# Patient Record
Sex: Male | Born: 1995 | Race: White | Hispanic: Yes | Marital: Single | Smoking: Current every day smoker
Health system: Southern US, Community
[De-identification: ages and names within clinical notes are randomized; demographics above are authoritative.]

## PROBLEM LIST (undated history)

## (undated) DIAGNOSIS — J302 Other seasonal allergic rhinitis: Secondary | ICD-10-CM

---
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
## 2016-03-19 ENCOUNTER — Emergency Department (HOSPITAL_COMMUNITY): Payer: Commercial Managed Care - HMO

## 2016-03-19 ENCOUNTER — Encounter (HOSPITAL_COMMUNITY): Payer: Self-pay | Admitting: Emergency Medicine

## 2016-03-19 ENCOUNTER — Emergency Department (HOSPITAL_COMMUNITY)
Admission: EM | Admit: 2016-03-19 | Discharge: 2016-03-19 | Disposition: A | Discharge status: Home / Self Care | Payer: Commercial Managed Care - HMO | Attending: Emergency Medicine | Admitting: Emergency Medicine

## 2016-03-19 DIAGNOSIS — Y9389 Activity, other specified: Secondary | ICD-10-CM | POA: Insufficient documentation

## 2016-03-19 DIAGNOSIS — S30811A Abrasion of abdominal wall, initial encounter: Secondary | ICD-10-CM | POA: Insufficient documentation

## 2016-03-19 DIAGNOSIS — S20312A Abrasion of left front wall of thorax, initial encounter: Secondary | ICD-10-CM | POA: Diagnosis not present

## 2016-03-19 DIAGNOSIS — Y929 Unspecified place or not applicable: Secondary | ICD-10-CM | POA: Diagnosis not present

## 2016-03-19 DIAGNOSIS — Y999 Unspecified external cause status: Secondary | ICD-10-CM | POA: Diagnosis not present

## 2016-03-19 DIAGNOSIS — S299XXA Unspecified injury of thorax, initial encounter: Secondary | ICD-10-CM | POA: Diagnosis present

## 2016-03-19 DIAGNOSIS — F17200 Nicotine dependence, unspecified, uncomplicated: Secondary | ICD-10-CM | POA: Insufficient documentation

## 2016-03-19 HISTORY — DX: Other seasonal allergic rhinitis: J30.2

## 2016-03-19 MED ORDER — ACETAMINOPHEN 325 MG PO TABS
650.0000 mg | ORAL_TABLET | Freq: Once | ORAL | Status: AC
  Administered 2016-03-19: 650 mg via ORAL
  Filled 2016-03-19: qty 2

## 2016-03-19 MED ORDER — CYCLOBENZAPRINE HCL 5 MG PO TABS: 5.0000 mg | ORAL_TABLET | Freq: Three times a day (TID) | ORAL | Status: AC | PRN
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
## 2016-03-19 NOTE — Discharge Instructions (Signed)
Read the information below.  Your x-rays were negative. You are being prescribed a muscle relaxer, take as needed for muscle spasms. You can take tylenol 650mg  or motrin 400mg  every 6hrs for pain control. Be sure to rest.  Use the prescribed medication as directed.  Please discuss all new medications with your pharmacist.   It is important that you follow up with your primary care provider in 7-10 days following a ED visit.  You may return to the Emergency Department at any time for worsening condition or any new symptoms that concern you. Return to ED if your symptoms worsen or you develop nausea, blood in urine, blood in stool, agitation, confusion, loss of consciousness, inability to awaken, changes in vision, worsening headache, seizures, or weakness/numbness.
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
## 2019-09-24 ENCOUNTER — Ambulatory Visit: Payer: Self-pay | Attending: Internal Medicine

## 2019-09-24 DIAGNOSIS — Z20822 Contact with and (suspected) exposure to covid-19: Secondary | ICD-10-CM

## 2019-09-24 DIAGNOSIS — Z20828 Contact with and (suspected) exposure to other viral communicable diseases: Secondary | ICD-10-CM | POA: Insufficient documentation

## 2019-09-25 LAB — NOVEL CORONAVIRUS, NAA: SARS-CoV-2, NAA: NOT DETECTED

## 2022-06-22 DIAGNOSIS — F331 Major depressive disorder, recurrent, moderate: Secondary | ICD-10-CM | POA: Diagnosis not present

## 2022-06-22 DIAGNOSIS — Z113 Encounter for screening for infections with a predominantly sexual mode of transmission: Secondary | ICD-10-CM | POA: Diagnosis not present

## 2022-06-22 DIAGNOSIS — Z131 Encounter for screening for diabetes mellitus: Secondary | ICD-10-CM | POA: Diagnosis not present

## 2022-06-22 DIAGNOSIS — F1010 Alcohol abuse, uncomplicated: Secondary | ICD-10-CM | POA: Diagnosis not present

## 2022-06-22 DIAGNOSIS — F411 Generalized anxiety disorder: Secondary | ICD-10-CM | POA: Diagnosis not present

## 2022-06-22 DIAGNOSIS — Z0000 Encounter for general adult medical examination without abnormal findings: Secondary | ICD-10-CM | POA: Diagnosis not present

## 2022-06-22 DIAGNOSIS — L709 Acne, unspecified: Secondary | ICD-10-CM | POA: Diagnosis not present

## 2022-06-22 DIAGNOSIS — Z13220 Encounter for screening for lipoid disorders: Secondary | ICD-10-CM | POA: Diagnosis not present

## 2022-08-03 DIAGNOSIS — F331 Major depressive disorder, recurrent, moderate: Secondary | ICD-10-CM | POA: Diagnosis not present

## 2022-08-03 DIAGNOSIS — F411 Generalized anxiety disorder: Secondary | ICD-10-CM | POA: Diagnosis not present

## 2022-08-05 DIAGNOSIS — F1010 Alcohol abuse, uncomplicated: Secondary | ICD-10-CM | POA: Diagnosis not present

## 2022-08-05 DIAGNOSIS — F331 Major depressive disorder, recurrent, moderate: Secondary | ICD-10-CM | POA: Diagnosis not present

## 2022-08-12 DIAGNOSIS — F1010 Alcohol abuse, uncomplicated: Secondary | ICD-10-CM | POA: Diagnosis not present

## 2022-08-12 DIAGNOSIS — F331 Major depressive disorder, recurrent, moderate: Secondary | ICD-10-CM | POA: Diagnosis not present

## 2022-08-26 DIAGNOSIS — F1010 Alcohol abuse, uncomplicated: Secondary | ICD-10-CM | POA: Diagnosis not present

## 2022-08-26 DIAGNOSIS — F331 Major depressive disorder, recurrent, moderate: Secondary | ICD-10-CM | POA: Diagnosis not present

## 2022-09-01 DIAGNOSIS — F331 Major depressive disorder, recurrent, moderate: Secondary | ICD-10-CM | POA: Diagnosis not present

## 2022-09-01 DIAGNOSIS — F1010 Alcohol abuse, uncomplicated: Secondary | ICD-10-CM | POA: Diagnosis not present

## 2022-09-06 DIAGNOSIS — F1010 Alcohol abuse, uncomplicated: Secondary | ICD-10-CM | POA: Diagnosis not present

## 2022-09-06 DIAGNOSIS — F331 Major depressive disorder, recurrent, moderate: Secondary | ICD-10-CM | POA: Diagnosis not present

## 2022-09-06 DIAGNOSIS — Z6825 Body mass index (BMI) 25.0-25.9, adult: Secondary | ICD-10-CM | POA: Diagnosis not present

## 2022-09-06 DIAGNOSIS — F411 Generalized anxiety disorder: Secondary | ICD-10-CM | POA: Diagnosis not present

## 2022-09-13 DIAGNOSIS — Z79899 Other long term (current) drug therapy: Secondary | ICD-10-CM | POA: Diagnosis not present

## 2022-09-13 DIAGNOSIS — F1010 Alcohol abuse, uncomplicated: Secondary | ICD-10-CM | POA: Diagnosis not present

## 2022-09-13 DIAGNOSIS — F331 Major depressive disorder, recurrent, moderate: Secondary | ICD-10-CM | POA: Diagnosis not present

## 2022-09-22 DIAGNOSIS — F1010 Alcohol abuse, uncomplicated: Secondary | ICD-10-CM | POA: Diagnosis not present

## 2022-09-22 DIAGNOSIS — F331 Major depressive disorder, recurrent, moderate: Secondary | ICD-10-CM | POA: Diagnosis not present

## 2022-10-06 DIAGNOSIS — F1010 Alcohol abuse, uncomplicated: Secondary | ICD-10-CM | POA: Diagnosis not present

## 2022-10-06 DIAGNOSIS — F331 Major depressive disorder, recurrent, moderate: Secondary | ICD-10-CM | POA: Diagnosis not present

## 2022-10-14 DIAGNOSIS — F4310 Post-traumatic stress disorder, unspecified: Secondary | ICD-10-CM | POA: Diagnosis not present

## 2022-10-14 DIAGNOSIS — F331 Major depressive disorder, recurrent, moderate: Secondary | ICD-10-CM | POA: Diagnosis not present

## 2022-10-14 DIAGNOSIS — F1010 Alcohol abuse, uncomplicated: Secondary | ICD-10-CM | POA: Diagnosis not present

## 2022-10-17 DIAGNOSIS — F411 Generalized anxiety disorder: Secondary | ICD-10-CM | POA: Diagnosis not present

## 2022-10-17 DIAGNOSIS — F1010 Alcohol abuse, uncomplicated: Secondary | ICD-10-CM | POA: Diagnosis not present

## 2022-10-17 DIAGNOSIS — F331 Major depressive disorder, recurrent, moderate: Secondary | ICD-10-CM | POA: Diagnosis not present

## 2022-10-20 DIAGNOSIS — F1010 Alcohol abuse, uncomplicated: Secondary | ICD-10-CM | POA: Diagnosis not present

## 2022-10-20 DIAGNOSIS — F331 Major depressive disorder, recurrent, moderate: Secondary | ICD-10-CM | POA: Diagnosis not present

## 2022-11-11 DIAGNOSIS — F331 Major depressive disorder, recurrent, moderate: Secondary | ICD-10-CM | POA: Diagnosis not present

## 2022-11-11 DIAGNOSIS — F1010 Alcohol abuse, uncomplicated: Secondary | ICD-10-CM | POA: Diagnosis not present

## 2022-11-11 DIAGNOSIS — F4310 Post-traumatic stress disorder, unspecified: Secondary | ICD-10-CM | POA: Diagnosis not present

## 2022-11-16 DIAGNOSIS — M26609 Unspecified temporomandibular joint disorder, unspecified side: Secondary | ICD-10-CM | POA: Diagnosis not present

## 2022-11-17 DIAGNOSIS — F331 Major depressive disorder, recurrent, moderate: Secondary | ICD-10-CM | POA: Diagnosis not present

## 2022-11-17 DIAGNOSIS — F1010 Alcohol abuse, uncomplicated: Secondary | ICD-10-CM | POA: Diagnosis not present

## 2022-11-30 DIAGNOSIS — F331 Major depressive disorder, recurrent, moderate: Secondary | ICD-10-CM | POA: Diagnosis not present

## 2022-11-30 DIAGNOSIS — F1010 Alcohol abuse, uncomplicated: Secondary | ICD-10-CM | POA: Diagnosis not present

## 2022-12-19 DIAGNOSIS — F331 Major depressive disorder, recurrent, moderate: Secondary | ICD-10-CM | POA: Diagnosis not present

## 2022-12-19 DIAGNOSIS — F4310 Post-traumatic stress disorder, unspecified: Secondary | ICD-10-CM | POA: Diagnosis not present

## 2022-12-19 DIAGNOSIS — F1010 Alcohol abuse, uncomplicated: Secondary | ICD-10-CM | POA: Diagnosis not present

## 2022-12-28 DIAGNOSIS — F331 Major depressive disorder, recurrent, moderate: Secondary | ICD-10-CM | POA: Diagnosis not present

## 2022-12-28 DIAGNOSIS — F1010 Alcohol abuse, uncomplicated: Secondary | ICD-10-CM | POA: Diagnosis not present

## 2023-01-11 DIAGNOSIS — F1010 Alcohol abuse, uncomplicated: Secondary | ICD-10-CM | POA: Diagnosis not present

## 2023-01-11 DIAGNOSIS — F331 Major depressive disorder, recurrent, moderate: Secondary | ICD-10-CM | POA: Diagnosis not present

## 2023-01-16 DIAGNOSIS — F39 Unspecified mood [affective] disorder: Secondary | ICD-10-CM | POA: Diagnosis not present

## 2023-01-16 DIAGNOSIS — F1020 Alcohol dependence, uncomplicated: Secondary | ICD-10-CM | POA: Diagnosis not present

## 2023-01-16 DIAGNOSIS — F4310 Post-traumatic stress disorder, unspecified: Secondary | ICD-10-CM | POA: Diagnosis not present

## 2023-01-24 DIAGNOSIS — F1020 Alcohol dependence, uncomplicated: Secondary | ICD-10-CM | POA: Diagnosis not present

## 2023-02-08 DIAGNOSIS — H5213 Myopia, bilateral: Secondary | ICD-10-CM | POA: Diagnosis not present

## 2023-02-08 DIAGNOSIS — H52203 Unspecified astigmatism, bilateral: Secondary | ICD-10-CM | POA: Diagnosis not present

## 2023-02-15 DIAGNOSIS — F331 Major depressive disorder, recurrent, moderate: Secondary | ICD-10-CM | POA: Diagnosis not present

## 2023-02-15 DIAGNOSIS — F1010 Alcohol abuse, uncomplicated: Secondary | ICD-10-CM | POA: Diagnosis not present

## 2023-02-17 DIAGNOSIS — F1020 Alcohol dependence, uncomplicated: Secondary | ICD-10-CM | POA: Diagnosis not present

## 2023-02-17 DIAGNOSIS — F39 Unspecified mood [affective] disorder: Secondary | ICD-10-CM | POA: Diagnosis not present

## 2023-02-17 DIAGNOSIS — F4310 Post-traumatic stress disorder, unspecified: Secondary | ICD-10-CM | POA: Diagnosis not present

## 2023-03-10 DIAGNOSIS — F1020 Alcohol dependence, uncomplicated: Secondary | ICD-10-CM | POA: Diagnosis not present

## 2023-03-16 DIAGNOSIS — F1011 Alcohol abuse, in remission: Secondary | ICD-10-CM | POA: Diagnosis not present

## 2023-03-16 DIAGNOSIS — F331 Major depressive disorder, recurrent, moderate: Secondary | ICD-10-CM | POA: Diagnosis not present

## 2023-03-16 DIAGNOSIS — F1020 Alcohol dependence, uncomplicated: Secondary | ICD-10-CM | POA: Diagnosis not present

## 2023-04-12 DIAGNOSIS — F4310 Post-traumatic stress disorder, unspecified: Secondary | ICD-10-CM | POA: Diagnosis not present

## 2023-04-12 DIAGNOSIS — F1020 Alcohol dependence, uncomplicated: Secondary | ICD-10-CM | POA: Diagnosis not present

## 2023-04-12 DIAGNOSIS — F39 Unspecified mood [affective] disorder: Secondary | ICD-10-CM | POA: Diagnosis not present

## 2023-04-14 DIAGNOSIS — F1010 Alcohol abuse, uncomplicated: Secondary | ICD-10-CM | POA: Diagnosis not present

## 2023-04-14 DIAGNOSIS — F331 Major depressive disorder, recurrent, moderate: Secondary | ICD-10-CM | POA: Diagnosis not present

## 2023-04-14 DIAGNOSIS — F1011 Alcohol abuse, in remission: Secondary | ICD-10-CM | POA: Diagnosis not present

## 2023-04-21 DIAGNOSIS — F1020 Alcohol dependence, uncomplicated: Secondary | ICD-10-CM | POA: Diagnosis not present

## 2023-05-12 DIAGNOSIS — F1020 Alcohol dependence, uncomplicated: Secondary | ICD-10-CM | POA: Diagnosis not present

## 2023-05-12 DIAGNOSIS — F4310 Post-traumatic stress disorder, unspecified: Secondary | ICD-10-CM | POA: Diagnosis not present

## 2023-05-12 DIAGNOSIS — F39 Unspecified mood [affective] disorder: Secondary | ICD-10-CM | POA: Diagnosis not present

## 2023-05-18 DIAGNOSIS — F1010 Alcohol abuse, uncomplicated: Secondary | ICD-10-CM | POA: Diagnosis not present

## 2023-05-18 DIAGNOSIS — F331 Major depressive disorder, recurrent, moderate: Secondary | ICD-10-CM | POA: Diagnosis not present

## 2023-05-19 DIAGNOSIS — F1020 Alcohol dependence, uncomplicated: Secondary | ICD-10-CM | POA: Diagnosis not present

## 2023-05-22 DIAGNOSIS — L700 Acne vulgaris: Secondary | ICD-10-CM | POA: Diagnosis not present

## 2023-05-22 DIAGNOSIS — D2239 Melanocytic nevi of other parts of face: Secondary | ICD-10-CM | POA: Diagnosis not present

## 2023-06-07 DIAGNOSIS — F1020 Alcohol dependence, uncomplicated: Secondary | ICD-10-CM | POA: Diagnosis not present

## 2023-06-13 DIAGNOSIS — F1020 Alcohol dependence, uncomplicated: Secondary | ICD-10-CM | POA: Diagnosis not present

## 2023-06-23 DIAGNOSIS — F4310 Post-traumatic stress disorder, unspecified: Secondary | ICD-10-CM | POA: Diagnosis not present

## 2023-06-23 DIAGNOSIS — F1020 Alcohol dependence, uncomplicated: Secondary | ICD-10-CM | POA: Diagnosis not present

## 2023-06-23 DIAGNOSIS — F39 Unspecified mood [affective] disorder: Secondary | ICD-10-CM | POA: Diagnosis not present

## 2023-07-17 DIAGNOSIS — F1020 Alcohol dependence, uncomplicated: Secondary | ICD-10-CM | POA: Diagnosis not present

## 2023-07-28 DIAGNOSIS — F1020 Alcohol dependence, uncomplicated: Secondary | ICD-10-CM | POA: Diagnosis not present

## 2023-07-28 DIAGNOSIS — Z79899 Other long term (current) drug therapy: Secondary | ICD-10-CM | POA: Diagnosis not present

## 2023-07-31 DIAGNOSIS — F331 Major depressive disorder, recurrent, moderate: Secondary | ICD-10-CM | POA: Diagnosis not present

## 2023-08-08 DIAGNOSIS — F1020 Alcohol dependence, uncomplicated: Secondary | ICD-10-CM | POA: Diagnosis not present

## 2023-08-08 DIAGNOSIS — F39 Unspecified mood [affective] disorder: Secondary | ICD-10-CM | POA: Diagnosis not present

## 2023-08-08 DIAGNOSIS — F4310 Post-traumatic stress disorder, unspecified: Secondary | ICD-10-CM | POA: Diagnosis not present

## 2024-04-19 DIAGNOSIS — F1020 Alcohol dependence, uncomplicated: Secondary | ICD-10-CM | POA: Diagnosis not present

## 2024-04-19 DIAGNOSIS — F1490 Cocaine use, unspecified, uncomplicated: Secondary | ICD-10-CM | POA: Diagnosis not present

## 2024-04-19 DIAGNOSIS — F39 Unspecified mood [affective] disorder: Secondary | ICD-10-CM | POA: Diagnosis not present

## 2024-04-19 DIAGNOSIS — F4310 Post-traumatic stress disorder, unspecified: Secondary | ICD-10-CM | POA: Diagnosis not present

## 2024-05-21 DIAGNOSIS — F331 Major depressive disorder, recurrent, moderate: Secondary | ICD-10-CM | POA: Diagnosis not present

## 2024-05-21 DIAGNOSIS — F1010 Alcohol abuse, uncomplicated: Secondary | ICD-10-CM | POA: Diagnosis not present

## 2024-05-24 DIAGNOSIS — F4310 Post-traumatic stress disorder, unspecified: Secondary | ICD-10-CM | POA: Diagnosis not present

## 2024-05-24 DIAGNOSIS — F39 Unspecified mood [affective] disorder: Secondary | ICD-10-CM | POA: Diagnosis not present

## 2024-05-24 DIAGNOSIS — F1020 Alcohol dependence, uncomplicated: Secondary | ICD-10-CM | POA: Diagnosis not present

## 2024-05-24 DIAGNOSIS — F1490 Cocaine use, unspecified, uncomplicated: Secondary | ICD-10-CM | POA: Diagnosis not present

## 2024-06-03 DIAGNOSIS — F1010 Alcohol abuse, uncomplicated: Secondary | ICD-10-CM | POA: Diagnosis not present

## 2024-06-03 DIAGNOSIS — F331 Major depressive disorder, recurrent, moderate: Secondary | ICD-10-CM | POA: Diagnosis not present

## 2024-06-24 DIAGNOSIS — F39 Unspecified mood [affective] disorder: Secondary | ICD-10-CM | POA: Diagnosis not present

## 2024-06-24 DIAGNOSIS — F1020 Alcohol dependence, uncomplicated: Secondary | ICD-10-CM | POA: Diagnosis not present

## 2024-06-24 DIAGNOSIS — F4310 Post-traumatic stress disorder, unspecified: Secondary | ICD-10-CM | POA: Diagnosis not present

## 2024-06-24 DIAGNOSIS — F1490 Cocaine use, unspecified, uncomplicated: Secondary | ICD-10-CM | POA: Diagnosis not present

## 2024-07-19 DIAGNOSIS — E7800 Pure hypercholesterolemia, unspecified: Secondary | ICD-10-CM | POA: Diagnosis not present

## 2024-07-19 DIAGNOSIS — Z23 Encounter for immunization: Secondary | ICD-10-CM | POA: Diagnosis not present

## 2024-07-19 DIAGNOSIS — Z0000 Encounter for general adult medical examination without abnormal findings: Secondary | ICD-10-CM | POA: Diagnosis not present

## 2024-07-22 DIAGNOSIS — F39 Unspecified mood [affective] disorder: Secondary | ICD-10-CM | POA: Diagnosis not present

## 2024-07-22 DIAGNOSIS — F1020 Alcohol dependence, uncomplicated: Secondary | ICD-10-CM | POA: Diagnosis not present

## 2024-07-22 DIAGNOSIS — F1490 Cocaine use, unspecified, uncomplicated: Secondary | ICD-10-CM | POA: Diagnosis not present

## 2024-07-22 DIAGNOSIS — F4310 Post-traumatic stress disorder, unspecified: Secondary | ICD-10-CM | POA: Diagnosis not present

## 2024-09-12 DIAGNOSIS — F1091 Alcohol use, unspecified, in remission: Secondary | ICD-10-CM | POA: Diagnosis not present

## 2024-09-12 DIAGNOSIS — F1490 Cocaine use, unspecified, uncomplicated: Secondary | ICD-10-CM | POA: Diagnosis not present

## 2024-09-12 DIAGNOSIS — F4310 Post-traumatic stress disorder, unspecified: Secondary | ICD-10-CM | POA: Diagnosis not present

## 2024-09-12 DIAGNOSIS — F39 Unspecified mood [affective] disorder: Secondary | ICD-10-CM | POA: Diagnosis not present
